# Patient Record
Sex: Female | Born: 1955
Health system: Southern US, Community
[De-identification: ages and names within clinical notes are randomized; demographics above are authoritative.]

## PROBLEM LIST (undated history)

## (undated) DIAGNOSIS — F419 Anxiety disorder, unspecified: Secondary | ICD-10-CM

## (undated) DIAGNOSIS — I10 Essential (primary) hypertension: Secondary | ICD-10-CM

## (undated) HISTORY — DX: Essential (primary) hypertension: I10

## (undated) HISTORY — DX: Anxiety disorder, unspecified: F41.9

---

## 2018-07-16 DIAGNOSIS — R82998 Other abnormal findings in urine: Secondary | ICD-10-CM | POA: Diagnosis not present

## 2018-07-16 DIAGNOSIS — Z1389 Encounter for screening for other disorder: Secondary | ICD-10-CM | POA: Diagnosis not present

## 2018-07-16 DIAGNOSIS — F17200 Nicotine dependence, unspecified, uncomplicated: Secondary | ICD-10-CM | POA: Diagnosis not present

## 2018-07-16 DIAGNOSIS — I10 Essential (primary) hypertension: Secondary | ICD-10-CM | POA: Diagnosis not present

## 2018-07-16 DIAGNOSIS — Z0000 Encounter for general adult medical examination without abnormal findings: Secondary | ICD-10-CM | POA: Diagnosis not present

## 2018-07-18 ENCOUNTER — Other Ambulatory Visit: Payer: Self-pay | Admitting: Internal Medicine

## 2018-07-18 DIAGNOSIS — Z1231 Encounter for screening mammogram for malignant neoplasm of breast: Secondary | ICD-10-CM

## 2018-07-29 DIAGNOSIS — R509 Fever, unspecified: Secondary | ICD-10-CM | POA: Diagnosis not present

## 2018-07-29 DIAGNOSIS — J018 Other acute sinusitis: Secondary | ICD-10-CM | POA: Diagnosis not present

## 2019-09-15 ENCOUNTER — Other Ambulatory Visit: Payer: Self-pay | Admitting: Internal Medicine

## 2019-09-15 DIAGNOSIS — Z1231 Encounter for screening mammogram for malignant neoplasm of breast: Secondary | ICD-10-CM

## 2020-09-08 ENCOUNTER — Other Ambulatory Visit: Payer: Self-pay | Admitting: Internal Medicine

## 2020-09-08 DIAGNOSIS — Z1231 Encounter for screening mammogram for malignant neoplasm of breast: Secondary | ICD-10-CM

## 2021-03-31 ENCOUNTER — Other Ambulatory Visit: Payer: Self-pay

## 2021-03-31 ENCOUNTER — Encounter (HOSPITAL_BASED_OUTPATIENT_CLINIC_OR_DEPARTMENT_OTHER): Payer: Self-pay | Admitting: Emergency Medicine

## 2021-03-31 ENCOUNTER — Emergency Department (HOSPITAL_BASED_OUTPATIENT_CLINIC_OR_DEPARTMENT_OTHER)
Admission: EM | Admit: 2021-03-31 | Discharge: 2021-04-01 | Disposition: A | Discharge status: Home / Self Care | Payer: 59 | Attending: Emergency Medicine | Admitting: Emergency Medicine

## 2021-03-31 ENCOUNTER — Emergency Department (HOSPITAL_BASED_OUTPATIENT_CLINIC_OR_DEPARTMENT_OTHER): Payer: 59 | Admitting: Radiology

## 2021-03-31 DIAGNOSIS — I10 Essential (primary) hypertension: Secondary | ICD-10-CM | POA: Diagnosis not present

## 2021-03-31 DIAGNOSIS — I160 Hypertensive urgency: Secondary | ICD-10-CM

## 2021-03-31 DIAGNOSIS — R0789 Other chest pain: Secondary | ICD-10-CM | POA: Insufficient documentation

## 2021-03-31 DIAGNOSIS — R0602 Shortness of breath: Secondary | ICD-10-CM | POA: Diagnosis not present

## 2021-03-31 DIAGNOSIS — Z87891 Personal history of nicotine dependence: Secondary | ICD-10-CM | POA: Insufficient documentation

## 2021-03-31 HISTORY — DX: Essential (primary) hypertension: I10

## 2021-03-31 LAB — CBC WITH DIFFERENTIAL/PLATELET
Abs Immature Granulocytes: 0.02 10*3/uL (ref 0.00–0.07)
Basophils Absolute: 0 10*3/uL (ref 0.0–0.1)
Basophils Relative: 1 %
Eosinophils Absolute: 0.1 10*3/uL (ref 0.0–0.5)
Eosinophils Relative: 2 %
HCT: 39.6 % (ref 36.0–46.0)
Hemoglobin: 13.8 g/dL (ref 12.0–15.0)
Immature Granulocytes: 0 %
Lymphocytes Relative: 37 %
Lymphs Abs: 2.7 10*3/uL (ref 0.7–4.0)
MCH: 32.1 pg (ref 26.0–34.0)
MCHC: 34.8 g/dL (ref 30.0–36.0)
MCV: 92.1 fL (ref 80.0–100.0)
Monocytes Absolute: 0.5 10*3/uL (ref 0.1–1.0)
Monocytes Relative: 7 %
Neutro Abs: 3.9 10*3/uL (ref 1.7–7.7)
Neutrophils Relative %: 53 %
Platelets: 303 10*3/uL (ref 150–400)
RBC: 4.3 MIL/uL (ref 3.87–5.11)
RDW: 13.3 % (ref 11.5–15.5)
WBC: 7.4 10*3/uL (ref 4.0–10.5)
nRBC: 0 % (ref 0.0–0.2)

## 2021-03-31 MED ORDER — CLONIDINE HCL 0.1 MG PO TABS
0.2000 mg | ORAL_TABLET | Freq: Once | ORAL | Status: AC
  Administered 2021-03-31: 0.2 mg via ORAL
  Filled 2021-03-31: qty 2

## 2021-03-31 NOTE — ED Triage Notes (Signed)
Pt arrives pov with c/o shob today. Pt reports sweats". Pt endorses hx of anxiety, reports L arm pain, bilateral neck pain.

## 2021-03-31 NOTE — ED Provider Notes (Signed)
[facility] EMERGENCY DEPT Provider Note   CSN: [id number] Arrival date & time: 03/31/21  1816     History Chief Complaint  Patient presents with   Shortness of Breath    Bridget Nichols is a 65 y.o. female.  Patient is a 65 year old female with past medical history of hypertension.  Patient presents today for evaluation of episodes of shortness of breath and tightness in her chest.  This began at approximately 6 PM.  She describes a tightness that came and went in waves.  Patient denies any nausea or diaphoresis.  Patient denies any prior cardiac history.  She takes lisinopril for blood pressure, but blood pressure this evening elevated over 200 at home.  The history is provided by the patient.  Shortness of Breath Severity:  Moderate Onset quality:  Sudden Duration:  5 hours Timing:  Intermittent Progression:  Waxing and waning Chronicity:  New Relieved by:  Nothing Worsened by:  Nothing Ineffective treatments:  None tried     Past Medical History:  Diagnosis Date   Hypertension     There are no problems to display for this patient.   Past Surgical History:  Procedure Laterality Date   CESAREAN SECTION       OB History   No obstetric history on file.     History reviewed. No pertinent family history.  Social History   Tobacco Use   Smoking status: Former    Types: Cigarettes  Substance Use Topics   Alcohol use: Yes    Comment: occ   Drug use: Never    Home Medications Prior to Admission medications   Not on File    Allergies    Patient has no allergy information on record.  Review of Systems   Review of Systems  Respiratory:  Positive for shortness of breath.   All other systems reviewed and are negative.  Physical Exam Updated Vital Signs BP (!) 219/109 Comment: RN aware  Pulse 63   Temp 97.9 F (36.6 C) (Oral)   Resp 18   Ht 5\' 5"  (1.651 m)   Wt 59 kg   SpO2 100%   BMI 21.63 kg/m   Physical Exam Vitals and  nursing note reviewed.  Constitutional:      General: She is not in acute distress.    Appearance: She is well-developed. She is not diaphoretic.  HENT:     Head: Normocephalic and atraumatic.  Cardiovascular:     Rate and Rhythm: Normal rate and regular rhythm.     Heart sounds: No murmur heard.   No friction rub. No gallop.  Pulmonary:     Effort: Pulmonary effort is normal. No respiratory distress.     Breath sounds: Normal breath sounds. No wheezing.  Abdominal:     General: Bowel sounds are normal. There is no distension.     Palpations: Abdomen is soft.     Tenderness: There is no abdominal tenderness.  Musculoskeletal:        General: Normal range of motion.     Cervical back: Normal range of motion and neck supple.     Right lower leg: No tenderness. No edema.     Left lower leg: No tenderness. No edema.  Skin:    General: Skin is warm and dry.  Neurological:     General: No focal deficit present.     Mental Status: She is alert and oriented to person, place, and time.    ED Results / Procedures / Treatments  Labs (all labs ordered are listed, but only abnormal results are displayed) Labs Reviewed  COMPREHENSIVE METABOLIC PANEL  BRAIN NATRIURETIC PEPTIDE  CBC WITH DIFFERENTIAL/PLATELET  TROPONIN I (HIGH SENSITIVITY)    EKG EKG Interpretation  Date/Time:  Thursday March 31 2021 19:17:11 EDT Ventricular Rate:  63 PR Interval:  168 QRS Duration: 80 QT Interval:  426 QTC Calculation: 435 R Axis:   79 Text Interpretation: Normal sinus rhythm Anteroseptal infarct , age undetermined Abnormal ECG Confirmed by Geoffery Lyons [phone]) on 03/31/2021 11:05:25 PM  Radiology DG Chest 2 View  Result Date: 03/31/2021 CLINICAL DATA:  Shortness of breath. EXAM: CHEST - 2 VIEW COMPARISON:  None. FINDINGS: The heart size and mediastinal contours are within normal limits. Both lungs are clear. The visualized skeletal structures are unremarkable. IMPRESSION: No active  cardiopulmonary disease. Electronically Signed   By: Lupita Raider M.D.   On: 03/31/2021 20:09    Procedures Procedures   Medications Ordered in ED Medications  cloNIDine (CATAPRES) tablet 0.2 mg (has no administration in time range)    ED Course  I have reviewed the triage vital signs and the nursing notes.  Pertinent labs & imaging results that were available during my care of the patient were reviewed by me and considered in my medical decision making (see chart for details).    MDM Rules/Calculators/A&P  Patient presenting here with complaints of elevated blood pressure.  She began feeling tight in the chest earlier this evening, then checked her blood pressure and it was over 200.  Patient arrives here with blood pressures consistently over 200, then was given clonidine and blood pressure now 129/73.  Her symptoms have completely resolved.  Work-up is otherwise unremarkable showing no evidence for an acute coronary issue.  At this point, patient seems appropriate for discharge with outpatient monitoring of her blood pressure.  I will prescribe a small quantity of clonidine which she can keep on hand if she has another such episode.  Final Clinical Impression(s) / ED Diagnoses Final diagnoses:  None    Rx / DC Orders ED Discharge Orders     None        Geoffery Lyons, MD 04/01/21 0110

## 2021-04-01 LAB — BRAIN NATRIURETIC PEPTIDE: B Natriuretic Peptide: 50.4 pg/mL (ref 0.0–100.0)

## 2021-04-01 LAB — COMPREHENSIVE METABOLIC PANEL
ALT: 11 U/L (ref 0–44)
AST: 14 U/L — ABNORMAL LOW (ref 15–41)
Albumin: 4.6 g/dL (ref 3.5–5.0)
Alkaline Phosphatase: 41 U/L (ref 38–126)
Anion gap: 10 (ref 5–15)
BUN: 15 mg/dL (ref 8–23)
CO2: 25 mmol/L (ref 22–32)
Calcium: 9.8 mg/dL (ref 8.9–10.3)
Chloride: 104 mmol/L (ref 98–111)
Creatinine, Ser: 0.65 mg/dL (ref 0.44–1.00)
GFR, Estimated: >60 mL/min (ref >60)
Glucose, Bld: 100 mg/dL — ABNORMAL HIGH (ref 70–99)
Potassium: 3.8 mmol/L (ref 3.5–5.1)
Sodium: 139 mmol/L (ref 135–145)
Total Bilirubin: 0.3 mg/dL (ref 0.3–1.2)
Total Protein: 7.1 g/dL (ref 6.5–8.1)

## 2021-04-01 LAB — TROPONIN I (HIGH SENSITIVITY): Troponin I (High Sensitivity): 7 ng/L (ref <18)

## 2021-04-01 MED ORDER — CLONIDINE HCL 0.1 MG PO TABS: 0.2000 mg | ORAL_TABLET | Freq: Two times a day (BID) | ORAL | 0 refills | Status: AC | PRN

## 2021-04-01 NOTE — Discharge Instructions (Signed)
Take clonidine as prescribed as needed for blood pressure greater than 200.  Keep a record of your blood pressures at home and follow-up with your primary doctor in the next 7 to 10 days.  Return to the emergency department if symptoms significantly worsen or change.

## 2021-04-11 ENCOUNTER — Encounter (HOSPITAL_BASED_OUTPATIENT_CLINIC_OR_DEPARTMENT_OTHER): Payer: Self-pay | Admitting: Obstetrics and Gynecology

## 2021-04-11 ENCOUNTER — Other Ambulatory Visit: Payer: Self-pay

## 2021-04-11 DIAGNOSIS — R6884 Jaw pain: Secondary | ICD-10-CM | POA: Insufficient documentation

## 2021-04-11 DIAGNOSIS — I10 Essential (primary) hypertension: Secondary | ICD-10-CM | POA: Insufficient documentation

## 2021-04-11 DIAGNOSIS — M25512 Pain in left shoulder: Secondary | ICD-10-CM | POA: Insufficient documentation

## 2021-04-11 DIAGNOSIS — R519 Headache, unspecified: Secondary | ICD-10-CM | POA: Diagnosis not present

## 2021-04-11 DIAGNOSIS — R030 Elevated blood-pressure reading, without diagnosis of hypertension: Secondary | ICD-10-CM | POA: Diagnosis present

## 2021-04-11 LAB — BASIC METABOLIC PANEL
Anion gap: 11 (ref 5–15)
BUN: 14 mg/dL (ref 8–23)
CO2: 24 mmol/L (ref 22–32)
Calcium: 10.1 mg/dL (ref 8.9–10.3)
Chloride: 100 mmol/L (ref 98–111)
Creatinine, Ser: 0.66 mg/dL (ref 0.44–1.00)
GFR, Estimated: >60 mL/min (ref >60)
Glucose, Bld: 99 mg/dL (ref 70–99)
Potassium: 3.7 mmol/L (ref 3.5–5.1)
Sodium: 135 mmol/L (ref 135–145)

## 2021-04-11 LAB — CBC
HCT: 40 % (ref 36.0–46.0)
Hemoglobin: 14.2 g/dL (ref 12.0–15.0)
MCH: 32.7 pg (ref 26.0–34.0)
MCHC: 35.5 g/dL (ref 30.0–36.0)
MCV: 92.2 fL (ref 80.0–100.0)
Platelets: 304 10*3/uL (ref 150–400)
RBC: 4.34 MIL/uL (ref 3.87–5.11)
RDW: 13 % (ref 11.5–15.5)
WBC: 7.9 10*3/uL (ref 4.0–10.5)
nRBC: 0 % (ref 0.0–0.2)

## 2021-04-11 LAB — TROPONIN I (HIGH SENSITIVITY): Troponin I (High Sensitivity): 6 ng/L (ref <18)

## 2021-04-11 NOTE — ED Triage Notes (Signed)
Patient reports to the ER for Hypertension. Patient reports her BP keeps going up and that "no one is taking it seriously". Patient reports she has seen her PCP and they changed her medication dose but that her BP is still 200's/110s. Patient endorses headache, denies N/V/D. Patient reports it causes pain in her left shoulder and jaw

## 2021-04-12 ENCOUNTER — Emergency Department (HOSPITAL_BASED_OUTPATIENT_CLINIC_OR_DEPARTMENT_OTHER)
Admission: EM | Admit: 2021-04-12 | Discharge: 2021-04-12 | Disposition: A | Discharge status: Home / Self Care | Payer: 59 | Attending: Emergency Medicine | Admitting: Emergency Medicine

## 2021-04-12 NOTE — ED Notes (Signed)
Pt notified registration staff that the she was leaving. Encouraged to stay. Pt left

## 2021-05-18 ENCOUNTER — Telehealth: Payer: Self-pay

## 2021-05-18 NOTE — Telephone Encounter (Signed)
NOTES SCANNED TO REFERRAL 

## 2021-05-18 NOTE — Telephone Encounter (Signed)
NOTES SCANNED TO REFERRAL RJ 

## 2021-05-23 ENCOUNTER — Other Ambulatory Visit: Payer: Self-pay | Admitting: Family Medicine

## 2021-05-23 DIAGNOSIS — I160 Hypertensive urgency: Secondary | ICD-10-CM

## 2021-05-24 ENCOUNTER — Other Ambulatory Visit: Payer: Self-pay | Admitting: Family Medicine

## 2021-05-24 DIAGNOSIS — R079 Chest pain, unspecified: Secondary | ICD-10-CM

## 2021-05-24 DIAGNOSIS — R7989 Other specified abnormal findings of blood chemistry: Secondary | ICD-10-CM

## 2021-05-25 ENCOUNTER — Ambulatory Visit
Admission: RE | Admit: 2021-05-25 | Discharge: 2021-05-25 | Disposition: A | Discharge status: Home / Self Care | Payer: 59 | Source: Ambulatory Visit | Attending: Family Medicine | Admitting: Family Medicine

## 2021-05-25 DIAGNOSIS — R079 Chest pain, unspecified: Secondary | ICD-10-CM

## 2021-05-25 DIAGNOSIS — R7989 Other specified abnormal findings of blood chemistry: Secondary | ICD-10-CM

## 2021-05-25 MED ORDER — IOPAMIDOL (ISOVUE-370) INJECTION 76%
75.0000 mL | Freq: Once | INTRAVENOUS | Status: AC | PRN
  Administered 2021-05-25: 75 mL via INTRAVENOUS

## 2021-05-27 DIAGNOSIS — I10 Essential (primary) hypertension: Secondary | ICD-10-CM | POA: Insufficient documentation

## 2021-05-27 NOTE — Assessment & Plan Note (Addendum)
I will restart the patient's lisinopril 40 mg and start Bystolic 2.5 mg.  I will have her see our pharmacy division here for med titration.  I will obtain an echocardiogram.  I will see her back in 3 months or earlier if needed.  We may need to consider adding back amlodipine if by systolic titration is not helpful.  Additionally she may need a work-up for secondary causes of hypertension.  A renal ultrasound has already been ordered.  If this is negative, she may need additional evaluation (hyperaldosteronism, pheochromocytoma, etc).

## 2021-05-27 NOTE — Progress Notes (Signed)
Cardiology Office Note:    Date:  05/30/2021   ID:  Bridget Nichols, DOB 08/10/1955, MRN [id number]  PCP:  Lavada Mesi, MD   [facility] Providers Cardiologist:  Orbie Pyo, MD     Referring MD: Lavada Mesi, MD   Chief Complaint: Hypertension  History of Present Illness:    PROBLEM LIST: 1.  Hypertension 2.  Aortic atherosclerosis seen on PE CT scan   Bridget Nichols is a 65 y.o. female with the indicated medical problems here for recommendations regarding hypertension.  She is seen in the emergency department in early September with a blood pressure of over .  Her EKG and troponins were unremarkable.  A renal ultrasound was ordered but has not yet been performed.  She was seen by her primary care provider and her lisinopril was stopped because it was not helping.  She had previously been on amlodipine 10 mg and hydrochlorothiazide.  These were stopped when clonidine was started.  She occasionally gets headaches.  At times she feels a chest fullness that lasts minutes that comes on at rest and sometimes with exertion.  She she has had no presyncope or syncope.  She denies any peripheral edema or paroxysmal nocturnal dyspnea.  She has required no hospitalizations or emergency room visits since her last one.    Previous Medical/Surgical History:    Past Medical History:  Diagnosis Date   Anxiety    High blood pressure    Hypertension     Past Surgical History:  Procedure Laterality Date   CESAREAN SECTION      Current Medications: Current Meds  Medication Sig   aspirin EC 81 MG tablet Take 1 tablet (81 mg total) by mouth daily. Swallow whole.   atorvastatin (LIPITOR) 40 MG tablet Take 1 tablet (40 mg total) by mouth daily.   cloNIDine (CATAPRES) 0.1 MG tablet Take 2 tablets (0.2 mg total) by mouth 2 (two) times daily as needed.   Collagen-Vitamin C-Biotin (COLLAGEN 1500/C PO) Take by mouth.   hydrALAZINE (APRESOLINE) 10 MG tablet Take 20 mg by mouth 4  (four) times daily.   Levomefolate Glucosamine (METHYLFOLATE) 400 MCG CAPS Take by mouth.   lisinopril (ZESTRIL) 40 MG tablet Take 1 tablet (40 mg total) by mouth daily.   LORazepam (ATIVAN) 0.5 MG tablet Take 0.5 mg by mouth every 8 (eight) hours.   metoprolol tartrate (LOPRESSOR) 100 MG tablet Take 1 tablet (100 mg total) by mouth once for 1 dose. Take two hours prior to CT scan   nebivolol (BYSTOLIC) 2.5 MG tablet Take 1 tablet (2.5 mg total) by mouth daily.   Omega-3 1000 MG CAPS Take by mouth.   Taurine 500 MG CAPS Take by mouth.   [DISCONTINUED] aspirin 81 MG EC tablet Take 162 mg by mouth 2 (two) times daily. Swallow whole.     Allergies:   Tetracycline   Social History:    Social History   Socioeconomic History   Marital status: Single    Spouse name: Not on file   Number of children: Not on file   Years of education: Not on file   Highest education level: Not on file  Occupational History   Not on file  Tobacco Use   Smoking status: Former    Types: Cigarettes    Passive exposure: Current   Smokeless tobacco: Never  Vaping Use   Vaping Use: Never used  Substance and Sexual Activity   Alcohol use: Yes    Comment: occ  Drug use: Never   Sexual activity: Yes  Other Topics Concern   Not on file  Social History Narrative   Not on file   Social Determinants of Health   Financial Resource Strain: Not on file  Food Insecurity: Not on file  Transportation Needs: Not on file  Physical Activity: Not on file  Stress: Not on file  Social Connections: Not on file     Family History:  The patient's family history is not on file.  ROS:  Please see the history of present illness.  All other systems reviewed and are negative.  EKGs/Labs/Other Studies Reviewed:    The following studies were reviewed today: EKG from early September demonstrates sinus rhythm with anterior infarct pattern.  Laboratories demonstrated normal high sensitive troponin and a normal  creatinine.    Recent Labs: 03/31/2021: ALT 11; B Natriuretic Peptide 50.4 04/11/2021: BUN 14; Creatinine, Ser 0.66; Hemoglobin 14.2; Platelets 304; Potassium 3.7; Sodium 135   Recent Lipid Panel No results found for: CHOL, TRIG, HDL, CHOLHDL, VLDL, LDLCALC, LDLDIRECT   Risk Assessment/Calculations:          Physical Exam:    VS:  BP (!) 174/90   Pulse 74   Ht 5\' 5"  (1.651 m)   Wt 131 lb (59.4 kg)   SpO2 98%   BMI 21.80 kg/m     Wt Readings from Last 3 Encounters:  05/30/21 131 lb (59.4 kg)  03/31/21 130 lb (59 kg)     GEN:  Well nourished, well developed in no acute distress HEENT: Normal NECK: No JVD; No carotid bruits LYMPHATICS: No lymphadenopathy CARDIAC: RRR, no murmurs, rubs, gallops RESPIRATORY:  Clear to auscultation without rales, wheezing or rhonchi  ABDOMEN: Soft, non-tender, non-distended MUSCULOSKELETAL:  No edema; No deformity  SKIN: Warm and dry NEUROLOGIC:  Alert and oriented x 3 PSYCHIATRIC:  Normal affect   ASSESSMENT and PLAN   Hypertension I will restart the patient's lisinopril 40 mg and start Bystolic 2.5 mg.  I will have her see our pharmacy division here for med titration.  I will obtain an echocardiogram.  I will see her back in 3 months or earlier if needed.  We may need to consider adding back amlodipine if by systolic titration is not helpful.  Additionally she may need a work-up for secondary causes of hypertension.  A renal ultrasound has already been ordered.  If this is negative, she may need additional evaluation (hyperaldosteronism, pheochromocytoma, etc).  Atherosclerosis of aorta (HCC) I will have her decrease her aspirin to 81 mg.  She has evidence of atherosclerosis in her aorta therefore we should treat this.  I will have her start atorvastatin 40 mg.  Her goal LDL is less than 70.  We will check a lipid panel and CMP later.  Precordial pain I will obtain a coronary CTA to evaluate further.              Medication  Adjustments/Labs and Tests Ordered: Current medicines are reviewed at length with the patient today.  Concerns regarding medicines are outlined above.  Orders Placed This Encounter  Procedures   CT CORONARY MORPH W/CTA COR W/SCORE W/CA W/CM &/OR WO/CM   Basic metabolic panel   Comprehensive metabolic panel   Lipid panel   AMB Referral to Heartcare Pharm-D   ECHOCARDIOGRAM COMPLETE    Meds ordered this encounter  Medications   aspirin EC 81 MG tablet    Sig: Take 1 tablet (81 mg total) by mouth daily. Swallow  whole.    Dispense:  90 tablet    Refill:  3   lisinopril (ZESTRIL) 40 MG tablet    Sig: Take 1 tablet (40 mg total) by mouth daily.    Dispense:  90 tablet    Refill:  3   nebivolol (BYSTOLIC) 2.5 MG tablet    Sig: Take 1 tablet (2.5 mg total) by mouth daily.    Dispense:  90 tablet    Refill:  3   atorvastatin (LIPITOR) 40 MG tablet    Sig: Take 1 tablet (40 mg total) by mouth daily.    Dispense:  90 tablet    Refill:  3   metoprolol tartrate (LOPRESSOR) 100 MG tablet    Sig: Take 1 tablet (100 mg total) by mouth once for 1 dose. Take two hours prior to CT scan    Dispense:  1 tablet    Refill:  0     Patient Instructions  Medication Instructions:  Your physician has recommended you make the following change in your medication:  1) DECREASE Aspirin to 81 mg daily  2) RESTART taking lisinopril 40 mg daily  3) START taking Bystolic (nebivolol) 2.5 mg daily  4) START taking Lipitor (atorvastatin) 40 mg daily   *If you need a refill on your cardiac medications before your next appointment, please call your pharmacy*  Lab Work: TODAY: BMET Fasting lipids and CMET on same day as 3 month FU  If you have labs (blood work) drawn today and your tests are completely normal, you will receive your results only by: MyChart Message (if you have MyChart) OR A paper copy in the mail If you have any lab test that is abnormal or we need to change your treatment, we will call  you to review the results.  Testing/Procedures: Your physician has requested that you have an echocardiogram. Echocardiography is a painless test that uses sound waves to create images of your heart. It provides your doctor with information about the size and shape of your heart and how well your heart's chambers and valves are working. This procedure takes approximately one hour. There are no restrictions for this procedure.  Follow-Up: At [facility], you and your health needs are our priority.  As part of our continuing mission to provide you with exceptional heart care, we have created designated Provider Care Teams.  These Care Teams include your primary Cardiologist (physician) and Advanced Practice Providers (APPs -  Physician Assistants and Nurse Practitioners) who all work together to provide you with the care you need, when you need it.  Your next appointment:   3 month(s)  The format for your next appointment:   In Person  Provider:   Orbie Pyo, MD {  Other Instructions You have been referred to see our PharmD for up-titration of your cardiac medications      Your cardiac CT will be scheduled at:   [facility] [address] [place], [place] [address] [phone]  If scheduled at [facility], please arrive at the [facility] main entrance (entrance A) of [facility] 30 minutes prior to test start time. You can use the FREE valet parking offered at the main entrance (encouraged to control the heart rate for the test) Proceed to the [facility] Radiology Department (first floor) to check-in and test prep.  Please follow these instructions carefully (unless otherwise directed):  On the Night Before the Test: Be sure to Drink plenty of water. Do  not consume any caffeinated/decaffeinated beverages or chocolate 12 hours prior to your test. Do not take any antihistamines 12 hours prior to your test.  On the Day of the Test: Drink  plenty of water until 1 hour prior to the test. Do not eat any food 4 hours prior to the test. You may take your regular medications prior to the test.  Take metoprolol (Lopressor) two hours prior to test. HOLD Furosemide/Hydrochlorothiazide morning of the test. FEMALES- please wear underwire-free bra if available, avoid dresses & tight clothing      After the Test: Drink plenty of water. After receiving IV contrast, you may experience a mild flushed feeling. This is normal. On occasion, you may experience a mild rash up to 24 hours after the test. This is not dangerous. If this occurs, you can take Benadryl 25 mg and increase your fluid intake. If you experience trouble breathing, this can be serious. If it is severe call 911 IMMEDIATELY. If it is mild, please call our office. If you take any of these medications: Glipizide/Metformin, Avandament, Glucavance, please do not take 48 hours after completing test unless otherwise instructed.  Please allow 2-4 weeks for scheduling of routine cardiac CTs. Some insurance companies require a pre-authorization which may delay scheduling of this test.   For non-scheduling related questions, please contact the cardiac imaging nurse navigator should you have any questions/concerns: Rockwell Alexandria, Cardiac Imaging Nurse Navigator Larey Brick, Cardiac Imaging Nurse Navigator [facility] Direct Office Dial: [phone]   For scheduling needs, including cancellations and rescheduling, please call [place], [phone].    Signed, Orbie Pyo, MD  05/30/2021 8:30 AM    [facility]

## 2021-05-30 ENCOUNTER — Encounter: Payer: Self-pay | Admitting: Internal Medicine

## 2021-05-30 ENCOUNTER — Ambulatory Visit (INDEPENDENT_AMBULATORY_CARE_PROVIDER_SITE_OTHER): Payer: 59 | Admitting: Internal Medicine

## 2021-05-30 ENCOUNTER — Other Ambulatory Visit: Payer: Self-pay

## 2021-05-30 ENCOUNTER — Other Ambulatory Visit: Payer: Self-pay | Admitting: Internal Medicine

## 2021-05-30 DIAGNOSIS — R072 Precordial pain: Secondary | ICD-10-CM | POA: Diagnosis not present

## 2021-05-30 DIAGNOSIS — I10 Essential (primary) hypertension: Secondary | ICD-10-CM

## 2021-05-30 DIAGNOSIS — I700 Atherosclerosis of aorta: Secondary | ICD-10-CM | POA: Diagnosis not present

## 2021-05-30 LAB — BASIC METABOLIC PANEL
BUN/Creatinine Ratio: 9 — ABNORMAL LOW (ref 12–28)
BUN: 6 mg/dL — ABNORMAL LOW (ref 8–27)
CO2: 25 mmol/L (ref 20–29)
Calcium: 10.1 mg/dL (ref 8.7–10.3)
Chloride: 103 mmol/L (ref 96–106)
Creatinine, Ser: 0.69 mg/dL (ref 0.57–1.00)
Glucose: 92 mg/dL (ref 70–99)
Potassium: 4.2 mmol/L (ref 3.5–5.2)
Sodium: 139 mmol/L (ref 134–144)
eGFR: 97 mL/min/{1.73_m2} (ref >59)

## 2021-05-30 MED ORDER — METOPROLOL TARTRATE 100 MG PO TABS: 100.0000 mg | ORAL_TABLET | Freq: Once | ORAL | 0 refills | Status: AC

## 2021-05-30 MED ORDER — ATORVASTATIN CALCIUM 40 MG PO TABS: 40.0000 mg | ORAL_TABLET | Freq: Every day | ORAL | 3 refills | Status: AC

## 2021-05-30 MED ORDER — LISINOPRIL 40 MG PO TABS: 40.0000 mg | ORAL_TABLET | Freq: Every day | ORAL | 3 refills | Status: DC

## 2021-05-30 MED ORDER — ASPIRIN EC 81 MG PO TBEC: 81.0000 mg | DELAYED_RELEASE_TABLET | Freq: Every day | ORAL | 3 refills | Status: AC

## 2021-05-30 MED ORDER — NEBIVOLOL HCL 2.5 MG PO TABS: 2.5000 mg | ORAL_TABLET | Freq: Every day | ORAL | 3 refills | Status: DC

## 2021-05-30 NOTE — Assessment & Plan Note (Addendum)
I will have her decrease her aspirin to 81 mg.  She has evidence of atherosclerosis in her aorta therefore we should treat this.  I will have her start atorvastatin 40 mg.  Her goal LDL is less than 70.  We will check a lipid panel and CMP later.

## 2021-05-30 NOTE — Assessment & Plan Note (Signed)
I will obtain a coronary CTA to evaluate further.

## 2021-05-30 NOTE — Patient Instructions (Addendum)
Medication Instructions:  Your physician has recommended you make the following change in your medication:  1) DECREASE Aspirin to 81 mg daily  2) RESTART taking lisinopril 40 mg daily  3) START taking Bystolic (nebivolol) 2.5 mg daily  4) START taking Lipitor (atorvastatin) 40 mg daily   *If you need a refill on your cardiac medications before your next appointment, please call your pharmacy*  Lab Work: TODAY: BMET Fasting lipids and CMET on same day as 3 month FU  If you have labs (blood work) drawn today and your tests are completely normal, you will receive your results only by: MyChart Message (if you have MyChart) OR A paper copy in the mail If you have any lab test that is abnormal or we need to change your treatment, we will call you to review the results.  Testing/Procedures: Your physician has requested that you have an echocardiogram. Echocardiography is a painless test that uses sound waves to create images of your heart. It provides your doctor with information about the size and shape of your heart and how well your heart's chambers and valves are working. This procedure takes approximately one hour. There are no restrictions for this procedure.  Follow-Up: At [facility], you and your health needs are our priority.  As part of our continuing mission to provide you with exceptional heart care, we have created designated Provider Care Teams.  These Care Teams include your primary Cardiologist (physician) and Advanced Practice Providers (APPs -  Physician Assistants and Nurse Practitioners) who all work together to provide you with the care you need, when you need it.  Your next appointment:   3 month(s)  The format for your next appointment:   In Person  Provider:   Orbie Pyo, MD {  Other Instructions You have been referred to see our PharmD for up-titration of your cardiac medications      Your cardiac CT will be scheduled at:   [facility] [address] [place], [place] [address] [phone]  If scheduled at [facility], please arrive at the [facility] main entrance (entrance A) of [facility] 30 minutes prior to test start time. You can use the FREE valet parking offered at the main entrance (encouraged to control the heart rate for the test) Proceed to the [facility] Radiology Department (first floor) to check-in and test prep.  Please follow these instructions carefully (unless otherwise directed):  On the Night Before the Test: Be sure to Drink plenty of water. Do not consume any caffeinated/decaffeinated beverages or chocolate 12 hours prior to your test. Do not take any antihistamines 12 hours prior to your test.  On the Day of the Test: Drink plenty of water until 1 hour prior to the test. Do not eat any food 4 hours prior to the test. You may take your regular medications prior to the test.  Take metoprolol (Lopressor) two hours prior to test. HOLD Furosemide/Hydrochlorothiazide morning of the test. FEMALES- please wear underwire-free bra if available, avoid dresses & tight clothing      After the Test: Drink plenty of water. After receiving IV contrast, you may experience a mild flushed feeling. This is normal. On occasion, you may experience a mild rash up to 24 hours after the test. This is not dangerous. If this occurs, you can take Benadryl 25 mg and increase your fluid intake. If you experience trouble breathing, this can be serious. If it is severe call 911  IMMEDIATELY. If it is mild, please call our office. If you take any of these medications: Glipizide/Metformin, Avandament, Glucavance, please do not take 48 hours after completing test unless otherwise instructed.  Please allow 2-4 weeks for scheduling of routine cardiac CTs. Some insurance companies require a pre-authorization which may delay scheduling of this test.   For non-scheduling related questions, please  contact the cardiac imaging nurse navigator should you have any questions/concerns: Rockwell Alexandria, Cardiac Imaging Nurse Navigator Larey Brick, Cardiac Imaging Nurse Navigator [place] [facility] Direct Office Dial: [phone]   For scheduling needs, including cancellations and rescheduling, please call [place], [phone].

## 2021-05-31 ENCOUNTER — Ambulatory Visit
Admission: RE | Admit: 2021-05-31 | Discharge: 2021-05-31 | Disposition: A | Discharge status: Home / Self Care | Payer: 59 | Source: Ambulatory Visit | Attending: Family Medicine | Admitting: Family Medicine

## 2021-05-31 DIAGNOSIS — I160 Hypertensive urgency: Secondary | ICD-10-CM

## 2021-06-01 ENCOUNTER — Encounter (HOSPITAL_BASED_OUTPATIENT_CLINIC_OR_DEPARTMENT_OTHER): Payer: Self-pay

## 2021-06-01 ENCOUNTER — Emergency Department (HOSPITAL_BASED_OUTPATIENT_CLINIC_OR_DEPARTMENT_OTHER)
Admission: EM | Admit: 2021-06-01 | Discharge: 2021-06-01 | Disposition: A | Discharge status: Home / Self Care | Payer: 59 | Attending: Emergency Medicine | Admitting: Emergency Medicine

## 2021-06-01 ENCOUNTER — Other Ambulatory Visit: Payer: Self-pay

## 2021-06-01 ENCOUNTER — Telehealth: Payer: Self-pay

## 2021-06-01 ENCOUNTER — Emergency Department (HOSPITAL_BASED_OUTPATIENT_CLINIC_OR_DEPARTMENT_OTHER): Payer: 59

## 2021-06-01 DIAGNOSIS — R002 Palpitations: Secondary | ICD-10-CM

## 2021-06-01 DIAGNOSIS — R0602 Shortness of breath: Secondary | ICD-10-CM | POA: Diagnosis present

## 2021-06-01 DIAGNOSIS — Z79899 Other long term (current) drug therapy: Secondary | ICD-10-CM | POA: Insufficient documentation

## 2021-06-01 DIAGNOSIS — I10 Essential (primary) hypertension: Secondary | ICD-10-CM | POA: Insufficient documentation

## 2021-06-01 DIAGNOSIS — F419 Anxiety disorder, unspecified: Secondary | ICD-10-CM | POA: Diagnosis not present

## 2021-06-01 DIAGNOSIS — Z7982 Long term (current) use of aspirin: Secondary | ICD-10-CM | POA: Diagnosis not present

## 2021-06-01 DIAGNOSIS — Z87891 Personal history of nicotine dependence: Secondary | ICD-10-CM | POA: Diagnosis not present

## 2021-06-01 LAB — CBC WITH DIFFERENTIAL/PLATELET
Abs Immature Granulocytes: 0.03 10*3/uL (ref 0.00–0.07)
Basophils Absolute: 0.1 10*3/uL (ref 0.0–0.1)
Basophils Relative: 1 %
Eosinophils Absolute: 0.2 10*3/uL (ref 0.0–0.5)
Eosinophils Relative: 3 %
HCT: 39.8 % (ref 36.0–46.0)
Hemoglobin: 13.8 g/dL (ref 12.0–15.0)
Immature Granulocytes: 0 %
Lymphocytes Relative: 42 %
Lymphs Abs: 3.1 10*3/uL (ref 0.7–4.0)
MCH: 32.5 pg (ref 26.0–34.0)
MCHC: 34.7 g/dL (ref 30.0–36.0)
MCV: 93.9 fL (ref 80.0–100.0)
Monocytes Absolute: 0.8 10*3/uL (ref 0.1–1.0)
Monocytes Relative: 11 %
Neutro Abs: 3.2 10*3/uL (ref 1.7–7.7)
Neutrophils Relative %: 43 %
Platelets: 337 10*3/uL (ref 150–400)
RBC: 4.24 MIL/uL (ref 3.87–5.11)
RDW: 13.7 % (ref 11.5–15.5)
WBC: 7.3 10*3/uL (ref 4.0–10.5)
nRBC: 0 % (ref 0.0–0.2)

## 2021-06-01 LAB — BASIC METABOLIC PANEL
Anion gap: 12 (ref 5–15)
BUN: 11 mg/dL (ref 8–23)
CO2: 22 mmol/L (ref 22–32)
Calcium: 9.1 mg/dL (ref 8.9–10.3)
Chloride: 104 mmol/L (ref 98–111)
Creatinine, Ser: 0.61 mg/dL (ref 0.44–1.00)
GFR, Estimated: >60 mL/min (ref >60)
Glucose, Bld: 79 mg/dL (ref 70–99)
Potassium: 3.7 mmol/L (ref 3.5–5.1)
Sodium: 138 mmol/L (ref 135–145)

## 2021-06-01 LAB — TSH: TSH: 4.507 u[IU]/mL — ABNORMAL HIGH (ref 0.350–4.500)

## 2021-06-01 LAB — TROPONIN I (HIGH SENSITIVITY): Troponin I (High Sensitivity): 5 ng/L (ref <18)

## 2021-06-01 MED ORDER — LORAZEPAM 2 MG/ML IJ SOLN
1.0000 mg | Freq: Once | INTRAMUSCULAR | Status: AC
  Administered 2021-06-01: 1 mg via INTRAVENOUS
  Filled 2021-06-01: qty 1

## 2021-06-01 NOTE — Telephone Encounter (Deleted)
**Note De-Identified Bridget Nichols Obfuscation** Nebivolol PA started through [facility]. Key: [id number]

## 2021-06-01 NOTE — ED Triage Notes (Signed)
Patient woke up and just did not feel right. Monitor at home revealed heart rate of 127 with elevated blood pressure of 197/90

## 2021-06-01 NOTE — Telephone Encounter (Signed)
This was [facility] 2 days ago at [facility]   The PA has been started.  A refill is not needed.

## 2021-06-01 NOTE — ED Provider Notes (Signed)
MEDCENTER [facility] EMERGENCY DEPT Provider Note   CSN: [id number] Arrival date & time: 06/01/21  0556     History Chief Complaint  Patient presents with   Tachycardia    Bridget Nichols is a 65 y.o. female.  HPI     This is a 65 year old female with a history of hypertension who presents with palpitations and shortness of breath.  Patient reports that she woke up around 2 AM and did not feel right.  She took her blood pressure and it was normal.  She laid back down and then woke up again at 5.  She took her blood pressure and pulse rate.  She revealed that her heart rate was in the 120s and her blood pressure was 197/90.  She states she felt very anxious.  It is unclear whether she felt anxious prior to giving the readings or just afterwards.  She denies chest pain but reports some shortness of breath and palpitations.  She reports that she is currently taking lisinopril and Bystolic.  She also takes clonidine up to 4 times daily.  She took a clonidine just prior to arrival.  She has been seen and evaluated multiple times for the same.  She saw cardiology on 11/7.  They have recommended echocardiogram, CT coronary study and adjusted her blood pressure medications.  She has not been able to pick up the additional medication.  She has no known thyroid disease.  No recent fevers or illnesses.  Past Medical History:  Diagnosis Date   Anxiety    High blood pressure    Hypertension     Patient Active Problem List   Diagnosis Date Noted   Atherosclerosis of aorta (HCC) 05/30/2021   Precordial pain 05/30/2021   Hypertension 05/27/2021    Past Surgical History:  Procedure Laterality Date   CESAREAN SECTION       OB History     Gravida      Para      Term      Preterm      AB      Living  2      SAB      IAB      Ectopic      Multiple      Live Births              History reviewed. No pertinent family history.  Social History   Tobacco Use    Smoking status: Former    Types: Cigarettes    Passive exposure: Current   Smokeless tobacco: Never  Vaping Use   Vaping Use: Never used  Substance Use Topics   Alcohol use: Yes    Comment: occ   Drug use: Never    Home Medications Prior to Admission medications   Medication Sig Start Date End Date Taking? Authorizing Provider  aspirin EC 81 MG tablet Take 1 tablet (81 mg total) by mouth daily. Swallow whole. 05/30/21   Orbie Pyo, MD  atorvastatin (LIPITOR) 40 MG tablet Take 1 tablet (40 mg total) by mouth daily. 05/30/21   Orbie Pyo, MD  cloNIDine (CATAPRES - DOSED IN MG/24 HR) 0.2 mg/24hr patch Place 0.2 mg onto the skin once a week. Patient not taking: Reported on 05/30/2021    [provider]  cloNIDine (CATAPRES) 0.1 MG tablet Take 2 tablets (0.2 mg total) by mouth 2 (two) times daily as needed. 04/01/21   Geoffery Lyons, MD  Collagen-Vitamin C-Biotin (COLLAGEN 1500/C PO) Take by mouth.  [provider]  hydrALAZINE (APRESOLINE) 10 MG tablet Take 20 mg by mouth 4 (four) times daily. 05/23/21   [provider]  hydrochlorothiazide (HYDRODIURIL) 25 MG tablet Take 25 mg by mouth daily. Patient not taking: Reported on 05/30/2021    [provider]  Levomefolate Glucosamine (METHYLFOLATE) 400 MCG CAPS Take by mouth.    [provider]  lisinopril (ZESTRIL) 40 MG tablet Take 1 tablet (40 mg total) by mouth daily. 05/30/21   Orbie Pyo, MD  LORazepam (ATIVAN) 0.5 MG tablet Take 0.5 mg by mouth every 8 (eight) hours.    [provider]  magnesium oxide (MAG-OX) 400 MG tablet Take 400 mg by mouth daily. Patient not taking: Reported on 05/30/2021    [provider]  metoprolol tartrate (LOPRESSOR) 100 MG tablet Take 1 tablet (100 mg total) by mouth once for 1 dose. Take two hours prior to CT scan 05/30/21 05/30/21  Orbie Pyo, MD  nebivolol (BYSTOLIC) 2.5 MG tablet Take 1 tablet (2.5 mg total) by mouth daily.  05/30/21   Orbie Pyo, MD  Omega-3 1000 MG CAPS Take by mouth.    [provider]  Taurine 500 MG CAPS Take by mouth.    [provider]    Allergies    Tetracycline  Review of Systems   Review of Systems  Constitutional:  Negative for fever.  Respiratory:  Positive for shortness of breath. Negative for cough and chest tightness.   Cardiovascular:  Positive for palpitations. Negative for chest pain.  Gastrointestinal:  Negative for abdominal pain, nausea and vomiting.  Genitourinary:  Negative for dysuria.  Skin:  Negative for wound.  Neurological:  Negative for headaches.  Psychiatric/Behavioral:  The patient is nervous/anxious.   All other systems reviewed and are negative.  Physical Exam Updated Vital Signs BP (!) 194/98 (BP Location: Right Arm)   Pulse 94   Resp (!) 24   Ht 1.626 m (5\' 4" )   Wt 58.5 kg   SpO2 100%   BMI 22.14 kg/m   Physical Exam Vitals and nursing note reviewed.  Constitutional:      Appearance: She is well-developed.     Comments: Anxious, tearful, nontoxic  HENT:     Head: Normocephalic and atraumatic.     Right Ear: Tympanic membrane normal.     Left Ear: Tympanic membrane normal.  Eyes:     Pupils: Pupils are equal, round, and reactive to light.  Cardiovascular:     Rate and Rhythm: Normal rate and regular rhythm.     Heart sounds: Normal heart sounds.  Pulmonary:     Effort: Pulmonary effort is normal. No respiratory distress.     Breath sounds: No wheezing.  Abdominal:     General: Bowel sounds are normal.     Palpations: Abdomen is soft.  Musculoskeletal:     Cervical back: Neck supple.     Right lower leg: No edema.     Left lower leg: No edema.  Skin:    General: Skin is warm and dry.  Neurological:     Mental Status: She is alert and oriented to person, place, and time.  Psychiatric:     Comments: Very anxious appearing    ED Results / Procedures / Treatments   Labs (all labs ordered are listed,  but only abnormal results are displayed) Labs Reviewed  CBC WITH DIFFERENTIAL/PLATELET  BASIC METABOLIC PANEL  TSH  TROPONIN I (HIGH SENSITIVITY)    EKG EKG Interpretation  Date/Time:  Wednesday June 01 2021 06:06:34 EST Ventricular Rate:  84 PR Interval:  172 QRS Duration: 88 QT Interval:  377 QTC Calculation: 446 R Axis:   60 Text Interpretation: Sinus rhythm Consider right atrial enlargement Anterior infarct, old Confirmed by Ross Marcus ([id number]) on 06/01/2021 6:17:52 AM  Radiology US RENAL ARTERY DUPLEX COMPLETE  Result Date: 05/31/2021 CLINICAL DATA:  Hypertensive urgency EXAM: RENAL/URINARY TRACT ULTRASOUND RENAL DUPLEX DOPPLER ULTRASOUND COMPARISON:  None. FINDINGS: Right Kidney: Length: 11.8 cm. Echogenicity within normal limits. No mass or hydronephrosis visualized. Left Kidney: Length: 11.4 cm. Echogenicity within normal limits. No mass or hydronephrosis visualized. 8 mm echogenic focus in the lower pole suspicious for nonobstructing calculus. Bladder:  Unremarkable. RENAL DUPLEX ULTRASOUND Right Renal Artery Velocities: Origin:  145 cm/sec Mid:  173 cm/sec Hilum:  157 cm/sec Interlobar:  77 cm/sec Arcuate:  36 cm/sec Left Renal Artery Velocities: Origin:  117 cm/sec Mid:  140 cm/sec Hilum:  163 cm/sec Interlobar:  87 cm/sec Arcuate:  31 cm/sec Aortic Velocity:  87/17 cm/sec Right Renal-Aortic Ratios: Origin: 1.7 Mid:  2.0 Hilum: 1.8 Interlobar: 0.9 Arcuate: 0.4 Left Renal-Aortic Ratios: Origin: 1.3 Mid: 1.6 Hilum: 1.9 Interlobar: 1.0 Arcuate: 0.4 IMPRESSION: No Doppler evidence of significant renal artery stenosis. Electronically Signed   By: Acquanetta Belling M.D.   On: 05/31/2021 10:11    Procedures Procedures   Medications Ordered in ED Medications  LORazepam (ATIVAN) injection 1 mg (has no administration in time range)    ED Course  I have reviewed the triage vital signs and the nursing notes.  Pertinent labs & imaging results that were available during my care of  the patient were reviewed by me and considered in my medical decision making (see chart for details).    MDM Rules/Calculators/A&P                           Patient presents with palpitations and shortness of breath.  She is obviously very anxious on exam.  She reports anxiety related to her symptoms and not having a diagnosis.  I question whether she may be having some rebound hypertension related to clonidine.  EKG here shows no evidence of acute ischemic or arrhythmic changes.  Doubt primary ACS but will send screening troponins.  Chest x-ray obtained.  Patient has previously recently had a PE study that was negative 1 week ago.  She was given a dose of Ativan for anxiety.  Patient signed out to oncoming provider.  Final Clinical Impression(s) / ED Diagnoses Final diagnoses:  Palpitations  Primary hypertension  Anxiety    Rx / DC Orders ED Discharge Orders     None        Chaniya Genter, Mayer Masker, MD 06/01/21 [phone]

## 2021-06-01 NOTE — Telephone Encounter (Signed)
**Note De-Identified Declynn Lopresti Obfuscation** Urgent Nebivolol PA started through covermymeds. Key: [id number]

## 2021-06-01 NOTE — Discharge Instructions (Signed)
Follow-up with your cardiologist and primary doctor as previously arranged.  Reviewed medications with them. Return for new concerns.  Your blood work today was reassuring.

## 2021-06-02 MED ORDER — METOPROLOL TARTRATE 50 MG PO TABS: 75.0000 mg | ORAL_TABLET | Freq: Two times a day (BID) | ORAL | 0 refills | Status: DC

## 2021-06-02 NOTE — Telephone Encounter (Signed)
**Note De-Identified Bridget Nichols Obfuscation** Per letter from [facility] the pt must first try and fail at least 2 of the preferred step therapy drugs which are: Atenolol, Bisoprolol, Metoprolol, Carvedilol, Betaxolol, or Acebutolol.  The pt is currently taking Metoprolol so she will need to try and fail one of the beta blockers listed above prior to them approving this Nebivolol PA.   Forwarding message to Dr Lynnette Caffey and his nurse for advisement to the pt.

## 2021-06-02 NOTE — Telephone Encounter (Signed)
Spoke with patient.  Made her aware of recommendations from [facility].  She will pick up and begin metoprolol tartrate 75 mg twice daily and monitor BPs. She will call in one week with update on her readings.    Pt aware to hold the 75 mg dose in AM of 11/17 when she is to take the 100 mg dose for her cardiac cta.

## 2021-06-02 NOTE — Telephone Encounter (Signed)
OK.   She is on one time metorpolol for her CTA scan.   Let's put her on metoprolol 75 bid and have her call with her BP in one week.  If it's high, then we can try carvedilol 75 bid.  If her BP is still high, then I want her to start bystolic and see pharmacists.  Thank you.

## 2021-06-08 ENCOUNTER — Telehealth (HOSPITAL_COMMUNITY): Payer: Self-pay | Admitting: Emergency Medicine

## 2021-06-08 ENCOUNTER — Other Ambulatory Visit: Payer: Self-pay | Admitting: Family Medicine

## 2021-06-08 DIAGNOSIS — R768 Other specified abnormal immunological findings in serum: Secondary | ICD-10-CM

## 2021-06-08 DIAGNOSIS — I10 Essential (primary) hypertension: Secondary | ICD-10-CM

## 2021-06-08 NOTE — Telephone Encounter (Signed)
Reaching out to patient to offer assistance regarding upcoming cardiac imaging study; pt verbalizes understanding of appt date/time, parking situation and where to check in, pre-test NPO status and medications ordered, and verified current allergies; name and call back number provided for further questions should they arise Rockwell Alexandria RN Navigator Cardiac Imaging [facility] [phone] office [phone] cell  Arrive 8:30a 100mg  metoprolol 2 hr prior to scan Patient scheduled for echo at [facility] at 7:00am Does not take HCTZ anymore Denies iv issues

## 2021-06-09 ENCOUNTER — Ambulatory Visit (HOSPITAL_BASED_OUTPATIENT_CLINIC_OR_DEPARTMENT_OTHER): Payer: 59

## 2021-06-09 ENCOUNTER — Other Ambulatory Visit: Payer: Self-pay

## 2021-06-09 ENCOUNTER — Other Ambulatory Visit: Payer: Self-pay | Admitting: Cardiology

## 2021-06-09 ENCOUNTER — Ambulatory Visit (HOSPITAL_COMMUNITY)
Admission: RE | Admit: 2021-06-09 | Discharge: 2021-06-09 | Disposition: A | Discharge status: Home / Self Care | Payer: 59 | Source: Ambulatory Visit | Attending: Cardiology | Admitting: Cardiology

## 2021-06-09 ENCOUNTER — Encounter (HOSPITAL_COMMUNITY): Payer: Self-pay

## 2021-06-09 ENCOUNTER — Ambulatory Visit (HOSPITAL_COMMUNITY)
Admission: RE | Admit: 2021-06-09 | Discharge: 2021-06-09 | Disposition: A | Discharge status: Home / Self Care | Payer: 59 | Source: Ambulatory Visit | Attending: Internal Medicine | Admitting: Internal Medicine

## 2021-06-09 DIAGNOSIS — I10 Essential (primary) hypertension: Secondary | ICD-10-CM | POA: Diagnosis not present

## 2021-06-09 DIAGNOSIS — R931 Abnormal findings on diagnostic imaging of heart and coronary circulation: Secondary | ICD-10-CM | POA: Diagnosis not present

## 2021-06-09 DIAGNOSIS — R079 Chest pain, unspecified: Secondary | ICD-10-CM | POA: Insufficient documentation

## 2021-06-09 DIAGNOSIS — R0789 Other chest pain: Secondary | ICD-10-CM

## 2021-06-09 DIAGNOSIS — I700 Atherosclerosis of aorta: Secondary | ICD-10-CM | POA: Insufficient documentation

## 2021-06-09 DIAGNOSIS — R072 Precordial pain: Secondary | ICD-10-CM

## 2021-06-09 LAB — ECHOCARDIOGRAM COMPLETE
Area-P 1/2: 3.77 cm2
S' Lateral: 2.2 cm

## 2021-06-09 MED ORDER — NITROGLYCERIN 0.4 MG SL SUBL
SUBLINGUAL_TABLET | SUBLINGUAL | Status: AC
  Administered 2021-06-09: 09:00:00 0.8 mg
  Filled 2021-06-09: qty 2

## 2021-06-09 MED ORDER — IOHEXOL 350 MG/ML SOLN
95.0000 mL | Freq: Once | INTRAVENOUS | Status: AC | PRN
  Administered 2021-06-09: 09:00:00 95 mL via INTRAVENOUS

## 2021-06-09 MED ORDER — NITROGLYCERIN 0.4 MG SL SUBL: 0.8000 mg | SUBLINGUAL_TABLET | Freq: Once | SUBLINGUAL | Status: DC

## 2021-06-14 ENCOUNTER — Telehealth: Payer: Self-pay | Admitting: Pharmacist

## 2021-06-14 ENCOUNTER — Other Ambulatory Visit: Payer: Self-pay

## 2021-06-14 ENCOUNTER — Ambulatory Visit (INDEPENDENT_AMBULATORY_CARE_PROVIDER_SITE_OTHER): Payer: 59 | Admitting: Pharmacist

## 2021-06-14 VITALS — BP 162/88 | HR 83

## 2021-06-14 DIAGNOSIS — I10 Essential (primary) hypertension: Secondary | ICD-10-CM

## 2021-06-14 LAB — BASIC METABOLIC PANEL
BUN/Creatinine Ratio: 14 (ref 12–28)
BUN: 9 mg/dL (ref 8–27)
CO2: 24 mmol/L (ref 20–29)
Calcium: 9.9 mg/dL (ref 8.7–10.3)
Chloride: 100 mmol/L (ref 96–106)
Creatinine, Ser: 0.66 mg/dL (ref 0.57–1.00)
Glucose: 99 mg/dL (ref 70–99)
Potassium: 4.1 mmol/L (ref 3.5–5.2)
Sodium: 139 mmol/L (ref 134–144)
eGFR: 98 mL/min/{1.73_m2} (ref >59)

## 2021-06-14 MED ORDER — HYDRALAZINE HCL 25 MG PO TABS: 37.5000 mg | ORAL_TABLET | Freq: Three times a day (TID) | ORAL | 3 refills | Status: DC

## 2021-06-14 MED ORDER — SPIRONOLACTONE 25 MG PO TABS: 12.5000 mg | ORAL_TABLET | Freq: Every day | ORAL | 3 refills | Status: AC

## 2021-06-14 NOTE — Patient Instructions (Signed)
Start taking hydralazine 37.5 (1.5 tablets) by mouth 3 times a day Continue lisinopril 40mg  and clonidine I will call you tomorrow with your lab results Please call me at [phone] with any questions

## 2021-06-14 NOTE — Telephone Encounter (Signed)
BMP stable. Will go ahead and start spironolactone 12.5mg  daily as discussed in office today. Repeat BMP 12/5. LVM for pt to call back.

## 2021-06-14 NOTE — Progress Notes (Signed)
Patient ID: Bridget Nichols                 DOB: August 25, 1955                      MRN: [id number]     HPI: Bridget Nichols is a 65 y.o. female referred by Dr. Lynnette Caffey to HTN clinic. PMH is significant for HTN, anxiety and aortic atherosclerosis seen on CT scan.  She was seen in the emergency department in early September with a blood pressure of over . She was seen by her primary care provider and her lisinopril was stopped because it was not helping.  She had previously been on amlodipine 10 mg and hydrochlorothiazide.  These were stopped when clonidine was started.   At last visit on 11/7 with Dr. Lynnette Caffey, lisinopril 40mg  daily was resumed and metoprolol 75mg  BID was started. She was seen on 11/9 in ED for palpitations. Symptoms were thought to mainly be related to anxiety and possibly rebound HTN from clonidine. Renal ultrasound normal- no evidence of renal artery stenosis. She was started on atorvastatin for atherosclerosis seen on CT.   Patient presents today to clinic. She admits that she has not started her atorvastatin. Would like to see endocrinology first. She is taking clonidine 4-5 times per day. It is the only thing that keeps her BP down per patient. She is taking her hydralazine in 4-5hr intervals, 4 times a day. Is not on the clonidine patch, it did not work. She stopped taking her metoprolol as it caused her nausea, dizziness and a body temperature of 94.5. She does admit she struggles with anxiety. She took antidepressants in the past but does not like the way they make her feel. She takes 0.25mg  of clonazepam a couple times a week when her heart starts racing.   She complains of blurry vision, headaches, pressure on both sides of neck, lump in throat, hair loss, bright flashes around periphery of eye- has been eye dr, fatigue, loss of appetite and always feels cold. Took her last clonidine dose around 8AM. Blood pressure starts around 147-150's systolic in the AM and increases  throughout the day. She checks BP 4 times a day, 3 times each check and takes the average.   She lives alone, which scares her. She plans to move to [place] to live with her sister, but not until she gets all her medicals things under control.   Current HTN meds: lisinopril 40mg  daily, hydralazine 20mg  four times a day,  clonidine 0.2mg  4-5 times a day as needed. Previously tried:  BP goal: <130//80  Family History: No family history on file.  Social History: former smoker, alcohol 1-2 beers couple times a week, no illicit drugs  Diet: organic vegetarian, no processed food  Exercise: walks, stopped doing intensive cardio during her work-up  Home BP readings: 150/80-210/97  Wt Readings from Last 3 Encounters:  06/01/21 129 lb (58.5 kg)  05/30/21 131 lb (59.4 kg)  03/31/21 130 lb (59 kg)   BP Readings from Last 3 Encounters:  06/09/21 136/64  06/01/21 115/68  05/30/21 (!) 174/90   Pulse Readings from Last 3 Encounters:  06/09/21 61  06/01/21 (!) 55  05/30/21 74    Renal function: Estimated Creatinine Clearance: 61.3 mL/min (by C-G formula based on SCr of 0.61 mg/dL).  Past Medical History:  Diagnosis Date   Anxiety    High blood pressure    Hypertension  Current Outpatient Medications on File Prior to Visit  Medication Sig Dispense Refill   aspirin EC 81 MG tablet Take 1 tablet (81 mg total) by mouth daily. Swallow whole. 90 tablet 3   atorvastatin (LIPITOR) 40 MG tablet Take 1 tablet (40 mg total) by mouth daily. 90 tablet 3   cloNIDine (CATAPRES - DOSED IN MG/24 HR) 0.2 mg/24hr patch Place 0.2 mg onto the skin once a week. (Patient not taking: Reported on 05/30/2021)     cloNIDine (CATAPRES) 0.1 MG tablet Take 2 tablets (0.2 mg total) by mouth 2 (two) times daily as needed. 20 tablet 0   Collagen-Vitamin C-Biotin (COLLAGEN 1500/C PO) Take by mouth.     hydrALAZINE (APRESOLINE) 10 MG tablet Take 20 mg by mouth 4 (four) times daily.     hydrochlorothiazide  (HYDRODIURIL) 25 MG tablet Take 25 mg by mouth daily. (Patient not taking: Reported on 05/30/2021)     Levomefolate Glucosamine (METHYLFOLATE) 400 MCG CAPS Take by mouth.     lisinopril (ZESTRIL) 40 MG tablet Take 1 tablet (40 mg total) by mouth daily. 90 tablet 3   LORazepam (ATIVAN) 0.5 MG tablet Take 0.5 mg by mouth every 8 (eight) hours.     magnesium oxide (MAG-OX) 400 MG tablet Take 400 mg by mouth daily. (Patient not taking: Reported on 05/30/2021)     metoprolol tartrate (LOPRESSOR) 100 MG tablet Take 1 tablet (100 mg total) by mouth once for 1 dose. Take two hours prior to CT scan 1 tablet 0   metoprolol tartrate (LOPRESSOR) 50 MG tablet Take 1.5 tablets (75 mg total) by mouth 2 (two) times daily. 90 tablet 0   Omega-3 1000 MG CAPS Take by mouth.     Taurine 500 MG CAPS Take by mouth.     No current facility-administered medications on file prior to visit.    Allergies  Allergen Reactions   Tetracycline Hives    There were no vitals taken for this visit.   Assessment/Plan:  1. Hypertension - Blood pressure is above goal of <130/80. Patient is understandably concerned and frustrated that no cause of her extremely elevated blood pressure has been found. We did discuss that her anxiety is most likely a large contributor. We talked about the importance of working with a phycologist to get this under better control. Most of her symptoms listed in the S/O are more than likely from her clonidine. She states that she is tired of feeling poorly. We did discuss this and the goal of getting her eventually off of this medication. She understands that this may take time and may require some trial and error. Will go ahead and increase her hydralazine to 37.5mg  THREE times a day. There is no need to take this medication 4 times a day, as it will not be evenly spread and it puts a lot of burden on the patient. Will check BMP today since resuming lisinopril. If BMP is stable, will plan to start  spironolactone 12.5mg  daily. F/u with labs on 12/5 and telephone call 12/6 due to no OV being available. Will adjust meds at that point based off of labs and BP. F/u in office 12/15.   Thank you  Olene Floss, Pharm.D, BCPS, CPP [facility]  [address] [place], [place] [address]  Phone: [phone]; Fax: [phone]

## 2021-06-15 NOTE — Telephone Encounter (Signed)
I spoke with patient. Advised medication change below. She states she is nervous about traveling with new medications and wishes to wait until Tuesday when she returns to start. Advised she should still increase her hydralazine to 37.5mg  three times a day. Start spironolactone on Tuesday.

## 2021-06-24 ENCOUNTER — Telehealth: Payer: Self-pay | Admitting: Internal Medicine

## 2021-06-24 ENCOUNTER — Ambulatory Visit
Admission: RE | Admit: 2021-06-24 | Discharge: 2021-06-24 | Disposition: A | Discharge status: Home / Self Care | Payer: 59 | Source: Ambulatory Visit | Attending: Family Medicine | Admitting: Family Medicine

## 2021-06-24 DIAGNOSIS — I10 Essential (primary) hypertension: Secondary | ICD-10-CM

## 2021-06-24 DIAGNOSIS — R768 Other specified abnormal immunological findings in serum: Secondary | ICD-10-CM

## 2021-06-24 NOTE — Telephone Encounter (Signed)
Patient called stating she didn't start her spironolactone until 11/29 does she still need labs on 12/5. I called pt and advised that we can move her labs to the day I see her in the office 12/15. She asked about cutting down on clonidine. She is down to 3 tablet a day. I advised for her to stay at 3 tablets a day for at least a week. If she feels she can go to 2 tablets a day she may cut back to 2 tablets a day after that. Will follow up in office 12/15.

## 2021-06-24 NOTE — Telephone Encounter (Signed)
Patient wanted to speak to the Pharmacist before her upcoming appointment

## 2021-06-26 ENCOUNTER — Other Ambulatory Visit: Payer: Self-pay | Admitting: Internal Medicine

## 2021-06-27 ENCOUNTER — Other Ambulatory Visit: Payer: 59

## 2021-06-27 ENCOUNTER — Ambulatory Visit: Payer: 59 | Admitting: Cardiovascular Disease

## 2021-07-07 ENCOUNTER — Other Ambulatory Visit: Payer: 59

## 2021-07-07 ENCOUNTER — Other Ambulatory Visit: Payer: Self-pay

## 2021-07-07 ENCOUNTER — Ambulatory Visit (INDEPENDENT_AMBULATORY_CARE_PROVIDER_SITE_OTHER): Payer: 59 | Admitting: Pharmacist

## 2021-07-07 VITALS — BP 146/70 | HR 70

## 2021-07-07 DIAGNOSIS — I10 Essential (primary) hypertension: Secondary | ICD-10-CM

## 2021-07-07 LAB — BASIC METABOLIC PANEL
BUN/Creatinine Ratio: 16 (ref 12–28)
BUN: 10 mg/dL (ref 8–27)
CO2: 22 mmol/L (ref 20–29)
Calcium: 9.9 mg/dL (ref 8.7–10.3)
Chloride: 95 mmol/L — ABNORMAL LOW (ref 96–106)
Creatinine, Ser: 0.64 mg/dL (ref 0.57–1.00)
Glucose: 90 mg/dL (ref 70–99)
Potassium: 4.5 mmol/L (ref 3.5–5.2)
Sodium: 133 mmol/L — ABNORMAL LOW (ref 134–144)
eGFR: 99 mL/min/{1.73_m2} (ref >59)

## 2021-07-07 LAB — URIC ACID: Uric Acid: 4.9 mg/dL (ref 3.0–7.2)

## 2021-07-07 NOTE — Progress Notes (Signed)
Patient ID: Bridget Nichols                 DOB: [birth year]                      MRN: [id number]     HPI: Bridget Nichols is a 65 y.o. female referred by Dr. Lynnette Caffey to HTN clinic. PMH is significant for HTN, anxiety and aortic atherosclerosis seen on CT scan.  She was seen in the emergency department in early September with a blood pressure of over . She was seen by her primary care provider and her lisinopril was stopped because it was not helping.  She had previously been on amlodipine 10 mg and hydrochlorothiazide.  These were stopped when clonidine was started.   At last visit on 11/7 with Dr. Lynnette Caffey, lisinopril 40mg  daily was resumed and metoprolol 75mg  BID was started. She was seen on 11/9 in ED for palpitations. Symptoms were thought to mainly be related to anxiety and possibly rebound HTN from clonidine. Renal ultrasound normal- no evidence of renal artery stenosis. She was started on atorvastatin for atherosclerosis seen on CT.   Patient was seen in the HTN clinic last on 11/22. At this visit hydralazine was changed from 20mg  4 times a day to 37.5mg  three times a day. She started on spironolactone 12.5mg  daily on 11/29 and she decreased her clonidine use to 3 times a day. She was encouraged to see a phycologist as her BP issues appear to be stemming from anxiety.   Patient presents today to clinic for follow up. She reports that her blood pressure is the same although the reading I got in clinic today (146/70) was much better than 162/88 last visits. She denies dizziness, lightheadedness (other than feeling a little spacey last night). Headaches and blurred vision cleared about 1 month ago. No SOB, swelling. She has 3 referrals into phycologists. Should hear back soon from them about setting up an appointment. She checks her BP in the AM sometimes. Does not check in regards to her medications. She also checks her BP around 3PM. It is not always super high, but usually it is. She does have  fears of living alone and the night time is worse.  Is taking her lorazepam. She plans to move to [place] 12/22 to live with her sister. Upset and wants to know why her BP is so high. Insurance wont pay for CT of her head but she might pay cash because she needs to make sure there is no tumors or bleeding.  Current HTN meds: lisinopril 40mg  daily, hydralazine 37.5mg  three times a day (7-8AM, 5PM, 10PM) spironolactone 12.5mg  daily, clonidine 0.2mg  3 times a day as needed (10AM, 2-4PM, 10PM) Previously tried: metoprolol (nausea, dizziness, hypothermia per pt) BP goal: <130//80  Family History: No family history on file.  Social History: former smoker, alcohol 1-2 beers couple times a week, no illicit drugs  Diet: organic vegetarian, no processed food  Exercise: walks, stopped doing intensive cardio during her work-up  Home BP readings: starts in the high 140's low 150's/high 70's to low 80's , late afternoon 190/90 or higher  Wt Readings from Last 3 Encounters:  06/01/21 129 lb (58.5 kg)  05/30/21 131 lb (59.4 kg)  03/31/21 130 lb (59 kg)   BP Readings from Last 3 Encounters:  06/14/21 (!) 162/88  06/09/21 136/64  06/01/21 115/68   Pulse Readings from Last 3 Encounters:  06/14/21 83  06/09/21 61  06/01/21 (!) 55    Renal function: CrCl cannot be calculated (Patient's most recent lab result is older than the maximum 21 days allowed.).  Past Medical History:  Diagnosis Date   Anxiety    High blood pressure    Hypertension     Current Outpatient Medications on File Prior to Visit  Medication Sig Dispense Refill   aspirin EC 81 MG tablet Take 1 tablet (81 mg total) by mouth daily. Swallow whole. 90 tablet 3   atorvastatin (LIPITOR) 40 MG tablet Take 1 tablet (40 mg total) by mouth daily. (Patient not taking: Reported on 06/14/2021) 90 tablet 3   clonazePAM (KLONOPIN) 0.5 MG tablet Take 0.25 mg by mouth 2 (two) times daily as needed for anxiety.     cloNIDine (CATAPRES) 0.1 MG  tablet Take 2 tablets (0.2 mg total) by mouth 2 (two) times daily as needed. (Patient taking differently: Take 0.2 mg by mouth 2 (two) times daily as needed. 4-5 times a day as need per pt) 20 tablet 0   Collagen-Vitamin C-Biotin (COLLAGEN 1500/C PO) Take by mouth.     hydrALAZINE (APRESOLINE) 25 MG tablet Take 1.5 tablets (37.5 mg total) by mouth 3 (three) times daily. 405 tablet 3   Levomefolate Glucosamine (METHYLFOLATE) 400 MCG CAPS Take by mouth.     lisinopril (ZESTRIL) 40 MG tablet Take 1 tablet (40 mg total) by mouth daily. 90 tablet 3   LORazepam (ATIVAN) 0.5 MG tablet Take 0.5 mg by mouth every 8 (eight) hours. (Patient not taking: Reported on 06/14/2021)     magnesium oxide (MAG-OX) 400 MG tablet Take 400 mg by mouth daily. (Patient not taking: Reported on 05/30/2021)     metoprolol tartrate (LOPRESSOR) 100 MG tablet Take 1 tablet (100 mg total) by mouth once for 1 dose. Take two hours prior to CT scan 1 tablet 0   Omega-3 1000 MG CAPS Take by mouth.     spironolactone (ALDACTONE) 25 MG tablet Take 0.5 tablets (12.5 mg total) by mouth daily. 45 tablet 3   Taurine 500 MG CAPS Take by mouth.     No current facility-administered medications on file prior to visit.    Allergies  Allergen Reactions   Tetracycline Hives    There were no vitals taken for this visit.   Assessment/Plan:  1. Hypertension - Blood pressure is above goal of <130/80. Patient is understandably concerned and frustrated that no cause of her extremely elevated blood pressure has been found. She knows that her anxiety is most likely a large contributor, although not sure which came first. We did discuss that there is some evidence that elevated uric acid could be a contributor. More research is still being conducted. Patient agreeable to checking uric acid level. Will increase hydralazine to 50mg  in the afternoon and remain at 37.5mg  in the AM and PM. BMP drawn today. If stable, will increase spironolactone to 25mg   daily. I will call patient tomorrow to review labs and confirm and changes. Continue lisinopril 40mg  daily and clonidine 0.2mg  three times a day. She has been checking her BP with her right arm. I have asked her to use her left and to bring her cuff to next visit. Follow up in 3 weeks due to provider schedule. Ultimate goal would be to get her off of clonidine.  Thank you  , Pharm.D, BCPS, CPP [facility]  [address] [place], [place] [address]  Phone: [phone]; Fax: [phone]

## 2021-07-07 NOTE — Patient Instructions (Signed)
Start checking your blood pressure using your left arm  Start taking hydralazine 37.5mg  in the AM, 50mg  in there afternoon and 37.5mg  in the evening. I will call you tomorrow with your lab results and if everything looks good, we will increase spironolactone to 25mg  daily.  Call me at [phone] with any questions

## 2021-07-07 NOTE — Addendum Note (Signed)
Addended by: Malena Peer D on: 07/07/2021 11:01 AM   Modules accepted: Orders

## 2021-07-08 ENCOUNTER — Telehealth: Payer: Self-pay | Admitting: Pharmacist

## 2021-07-08 MED ORDER — AMLODIPINE BESYLATE 5 MG PO TABS: 5.0000 mg | ORAL_TABLET | Freq: Every day | ORAL | 3 refills | Status: AC

## 2021-07-08 NOTE — Telephone Encounter (Addendum)
Sodium just slightly low. Will not increase spironolactone. Continue spironolactone 12.5mg  daily, lisinopril 40mg  daily, clonidine 0.2mg  three times a day. Add amlodipine 5mg  daily. Called pt and made her aware. She reports acid reflux in the PM. Advised to try tums. Uric acid was normal <6

## 2021-07-29 ENCOUNTER — Ambulatory Visit: Payer: 59

## 2021-08-27 NOTE — Progress Notes (Deleted)
Cardiology Office Note:    Date:  08/27/2021   ID:  Bridget Nichols, DOB 04/24/1956, MRN [id number]  PCP:  Eunice Blase, MD   [facility] Prov[facility] Cardiologist:  Lenna Sciara, MD Referring MD: Eunice Blase, MD   Chief Complaint/Reason for Referral: Follow-up  ASSESSMENT:    Hypertension, unspecified type  Other chest pain  Atherosclerosis of aorta [facility])    PLAN:    In order of problems listed above:  1.  Hypertension consider increasing amlodipine.  2.  Consider CTA   3.  Follow-up lipid panel          {Are you ordering a CV Procedure (e.g. stress test, cath, DCCV, TEE, etc)?   Press F2        :[id number]   Dispo:  No follow-ups on file.     Medication Adjustments/Labs and Tests Ordered: Current medicines are reviewed at length with the patient today.  Concerns regarding medicines are outlined above.   Tests Ordered: No orders of the defined types were placed in this encounter.   Medication Changes: No orders of the defined types were placed in this encounter.   History of Present Illness:    FOCUSED CARDIOVASCULAR PROBLEM LIST:   1.  Hypertension; renal ultrasound November 2022 negative for renal artery stenosis 2.  Aortic atherosclerosis seen on PE CT scan   The patient is a 66 y.o. female with the indicated medical history here for follow-up.  When I saw her last I referred her for coronary CTA, echocardiogram, she was started on atorvastatin 40 mg, Bystolic 2.5 mg, lisinopril 40 mg and aspirin 81 mg.  She was seen by pharmacy and her hydralazine in the afternoon was increased to 50 mg and it remained at 37.5 mg in the morning.  Amlodipine 5 mg was added.  It was believed that there was quite a significant anxiety component associated with her hypertension.        Previous Medical History: Past Medical History:  Diagnosis Date   Anxiety    High blood pressure    Hypertension      Current Medications: No outpatient medications have  been marked as taking for the 08/29/21 encounter (Appointment) with Early Osmond, MD.     Allergies:    Tetracycline   Social History:   Social History   Tobacco Use   Smoking status: Former    Types: Cigarettes    Passive exposure: Current   Smokeless tobacco: Never  Vaping Use   Vaping Use: Never used  Substance Use Topics   Alcohol use: Yes    Comment: occ   Drug use: Never     Family Hx: No family history on file.   Review of Systems:   Please see the history of present illness.    All other systems reviewed and are negative.     EKGs/Labs/Other Test Reviewed:    EKG:  SR with anterior infarct pattern  Prior CV studies: 11/22 TTE 1. Left ventricular ejection fraction, by estimation, is 60 to 65%. The  left ventricle has normal function. The left ventricle has no regional  wall motion abnormalities. There is mild left ventricular hypertrophy.  Left ventricular diastolic parameters  are indeterminate.   2. Right ventricular systolic function is normal. The right ventricular  size is normal. There is normal pulmonary artery systolic pressure. The  estimated right ventricular systolic pressure is [id number] mmHg.   3. The mitral valve is normal in structure. Trivial mitral valve  regurgitation. No  evidence of mitral stenosis.   4. The aortic valve is tricuspid. Aortic valve regurgitation is not  visualized. Aortic valve sclerosis/calcification is present, without any  evidence of aortic stenosis.   5. The inferior vena cava is normal in size with greater than 50%  respiratory variability, suggesting right atrial pressure of 3 mmHg.   Renal U/S 11/22 No Doppler evidence of significant renal artery stenosis.  Imaging studies that I have independently reviewed today: TTE and renal u/s  Recent Labs: 03/31/2021: ALT 11; B Natriuretic Peptide 50.4 06/01/2021: Hemoglobin 13.8; Platelets 337; TSH 4.507 07/07/2021: BUN 10; Creatinine, Ser 0.64; Potassium 4.5; Sodium 133    Recent Lipid Panel No results found for: CHOL, TRIG, HDL, LDLCALC, LDLDIRECT  Risk Assessment/Calculations:    {Does this patient have ATRIAL FIBRILLATION?:[id number]}      Physical Exam:    VS:  There were no vitals taken for this visit.   Wt Readings from Last 3 Encounters:  06/01/21 129 lb (58.5 kg)  05/30/21 131 lb (59.4 kg)  03/31/21 130 lb (59 kg)    GENERAL:  No apparent distress, AOx3 HEENT:  No carotid bruits, +2 carotid impulses, no scleral icterus CAR: RRR Irregular RR*** no murmurs***, gallops, rubs, or thrills RES:  Clear to auscultation bilaterally ABD:  Soft, nontender, nondistended, positive bowel sounds x 4 VASC:  +2 radial pulses, +2 carotid pulses, palpable pedal pulses NEURO:  CN 2-12 grossly intact; motor and sensory grossly intact PSYCH:  No active depression or anxiety EXT:  No edema, ecchymosis, or cyanosis  Signed, Early Osmond, MD  08/27/2021 5:59 AM    [address] [place], [place],   [address] Phone: [phone]; Fax: [phone]   Note:  This document was prepared using Dragon voice recognition software and may include unintentional dictation errors.

## 2021-08-29 ENCOUNTER — Other Ambulatory Visit: Payer: 59

## 2021-08-29 ENCOUNTER — Ambulatory Visit: Payer: 59 | Admitting: Internal Medicine

## 2021-08-29 DIAGNOSIS — I700 Atherosclerosis of aorta: Secondary | ICD-10-CM

## 2021-08-29 DIAGNOSIS — R0789 Other chest pain: Secondary | ICD-10-CM

## 2021-08-29 DIAGNOSIS — I10 Essential (primary) hypertension: Secondary | ICD-10-CM

## 2022-03-28 ENCOUNTER — Other Ambulatory Visit: Payer: Self-pay | Admitting: Family Medicine

## 2022-03-29 ENCOUNTER — Other Ambulatory Visit: Payer: Self-pay | Admitting: Family Medicine

## 2022-03-29 DIAGNOSIS — E063 Autoimmune thyroiditis: Secondary | ICD-10-CM

## 2022-03-30 ENCOUNTER — Ambulatory Visit
Admission: RE | Admit: 2022-03-30 | Discharge: 2022-03-30 | Disposition: A | Discharge status: Home / Self Care | Payer: Medicare HMO | Source: Ambulatory Visit | Attending: Family Medicine | Admitting: Family Medicine

## 2022-03-30 DIAGNOSIS — E063 Autoimmune thyroiditis: Secondary | ICD-10-CM

## 2022-05-28 ENCOUNTER — Other Ambulatory Visit: Payer: Self-pay | Admitting: Internal Medicine

## 2022-06-11 ENCOUNTER — Other Ambulatory Visit: Payer: Self-pay | Admitting: Internal Medicine

## 2023-11-02 IMAGING — US US THYROID
1 series · 14 of 25 positions shown · non-contrast
Comparison: None.

CLINICAL DATA: Other.  Positive thyroglobulin

EXAM:
THYROID ULTRASOUND
TECHNIQUE: Ultrasound examination of the thyroid gland and adjacent soft
tissues was performed.

[Series 1: us thyroid · 0.04mm/px · 14 of 34 slices shown]
[im 1/34]
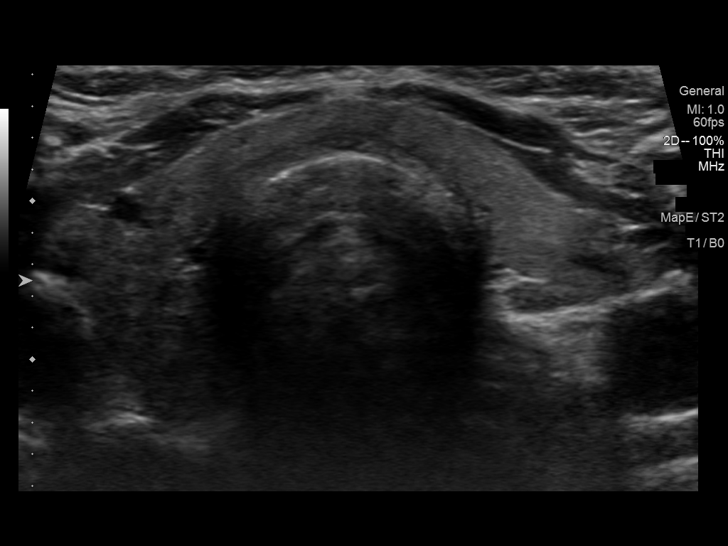
[im 3/34]
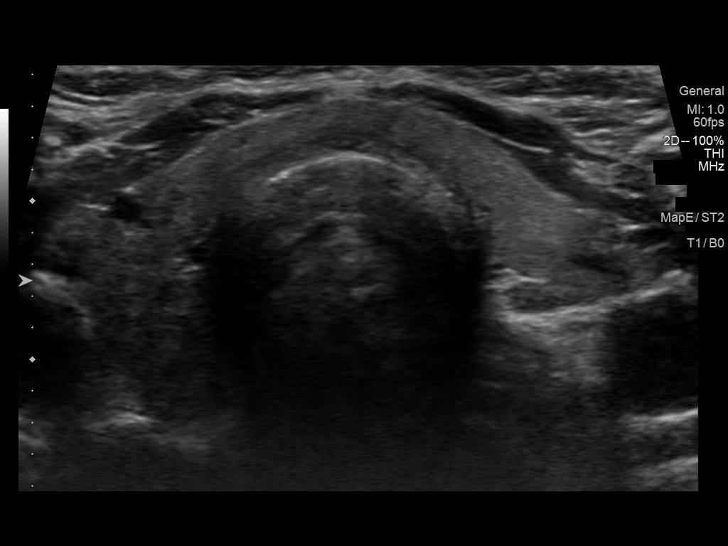
[im 6/34]
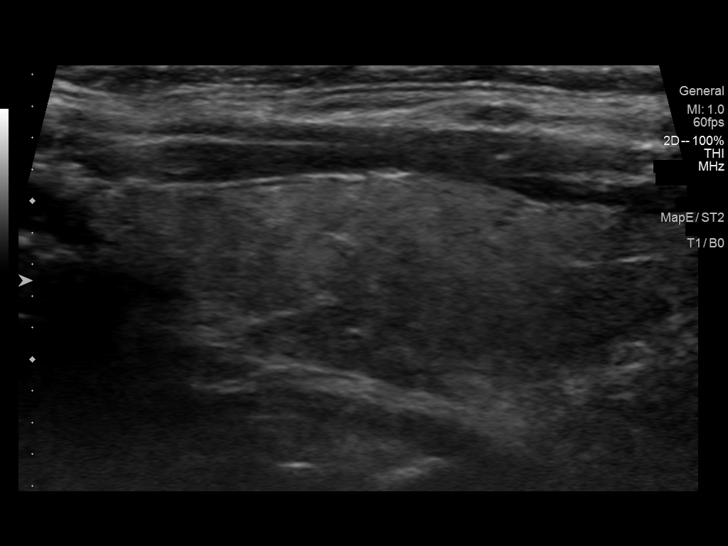
[im 9/34]
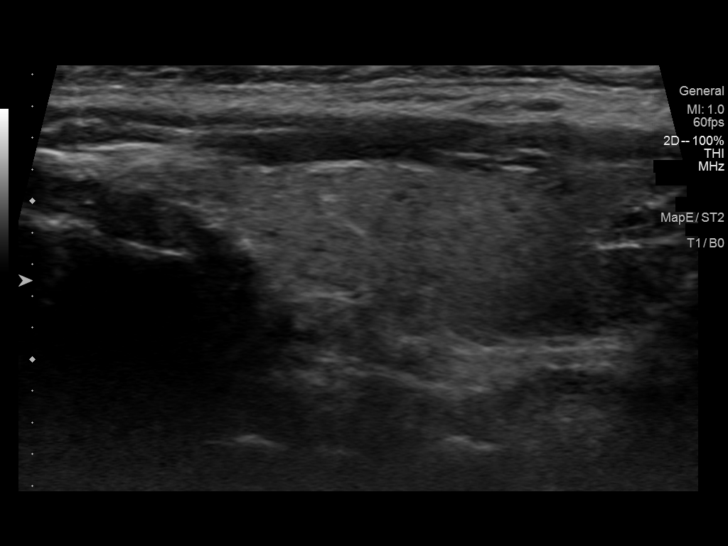
[im 12/34]
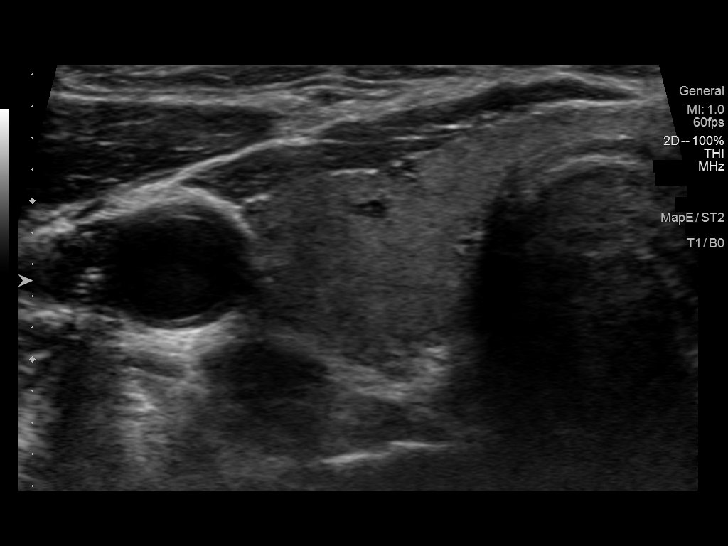
[im 13/34]
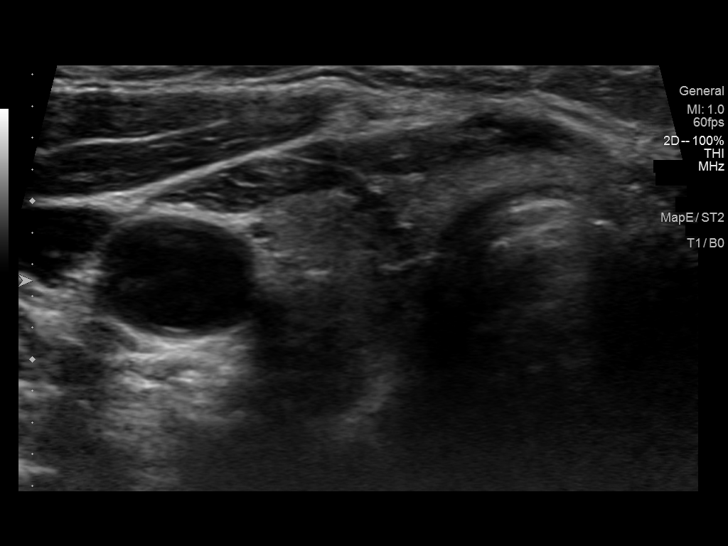
[im 16/34]
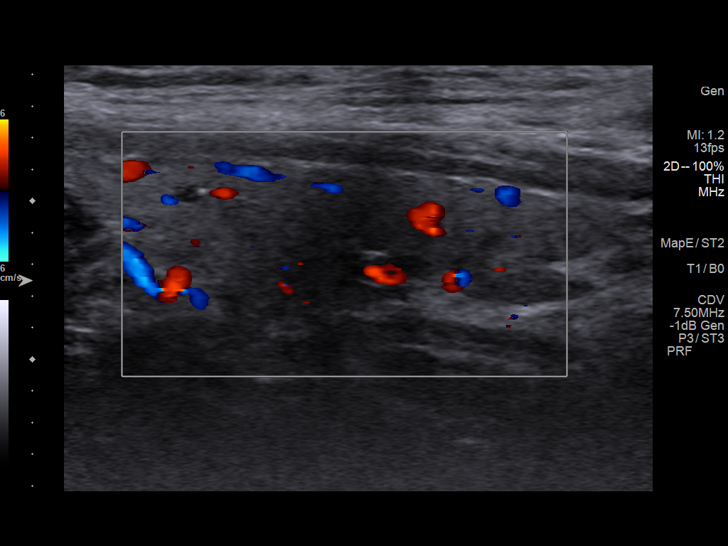
[im 18/34]
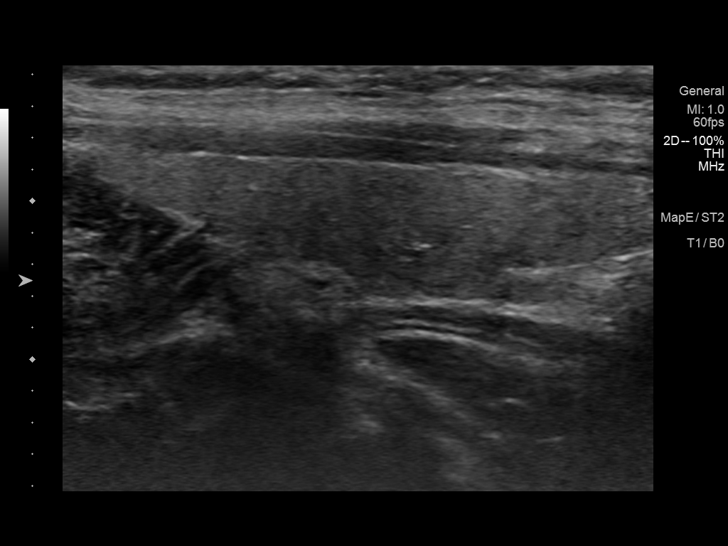
[im 21/34]
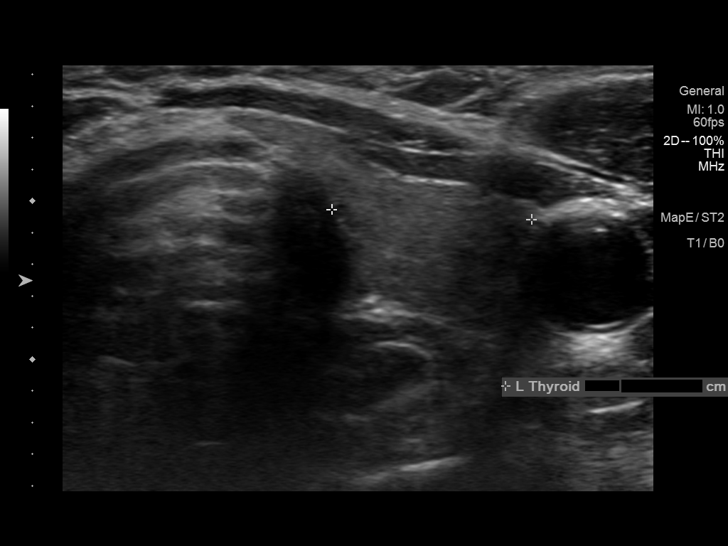
[im 23/34]
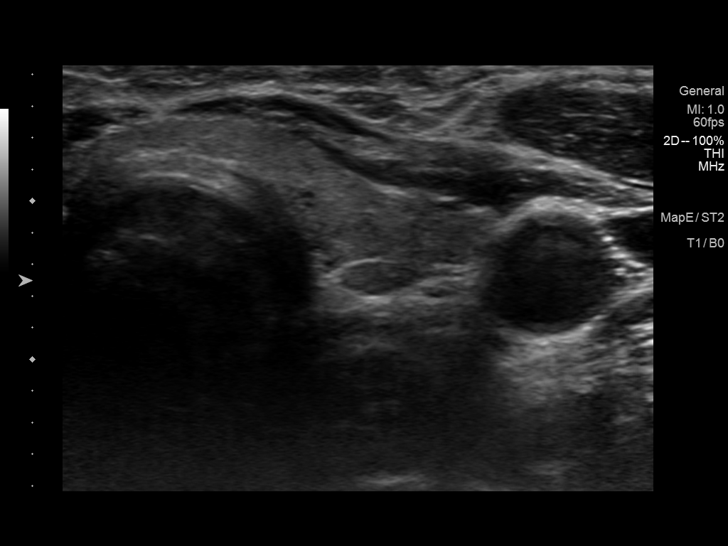
[im 25/34]
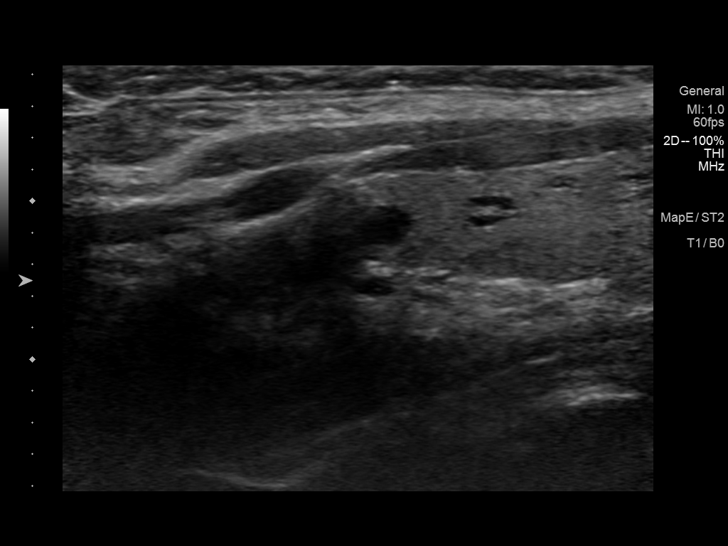
[im 28/34]
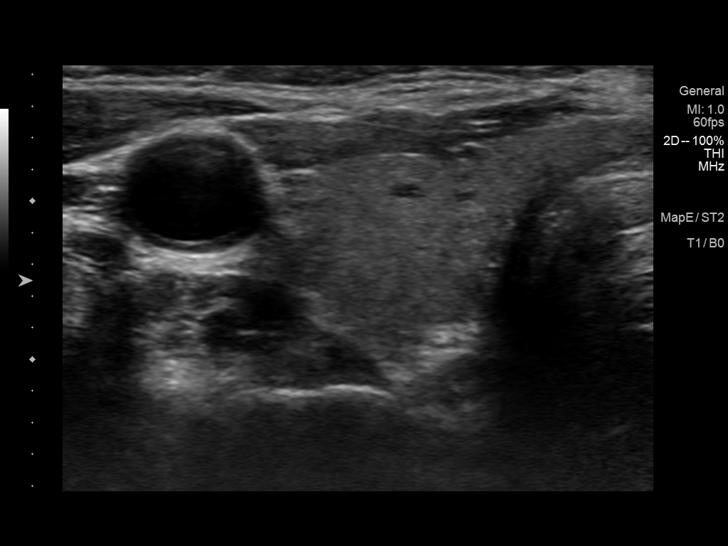
[im 31/34]
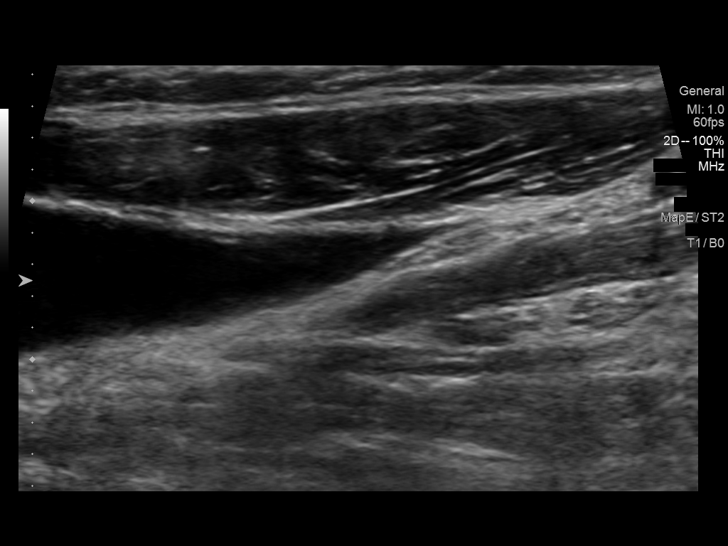
[im 34/34]
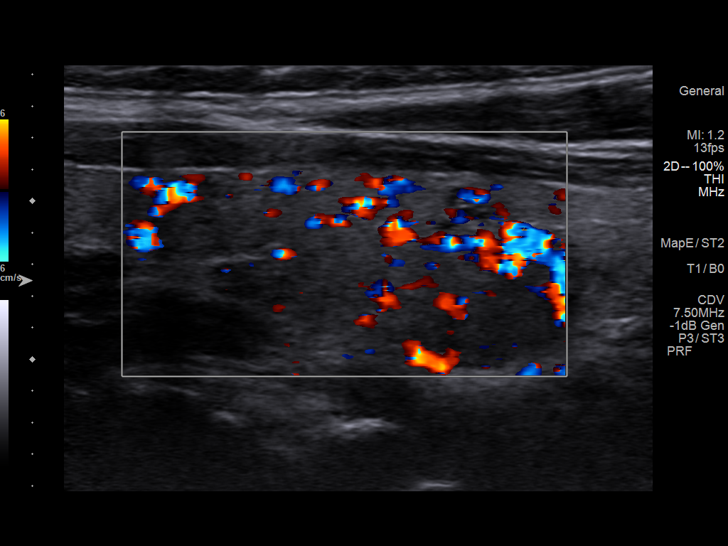

[14 of 25 positions shown; findings below may reference images not displayed]

FINDINGS: Parenchymal Echotexture: Mildly heterogenous

Isthmus: 0.3 cm

Right lobe: 3.8 x 1.1 x 1.5 cm

Left lobe: 3.5 x 1.0 x 1.3 cm

_________________________________________________________

Estimated total number of nodules >/= 1 cm: 0

Number of spongiform nodules >/=  2 cm not described below (TR1): 0

Number of mixed cystic and solid nodules >/= 1.5 cm not described
below (TR2): 0

_________________________________________________________

No discrete nodules are seen within the thyroid gland. Normal
vascularity. No significant hypervascularity.
IMPRESSION: Mildly heterogeneous but otherwise unremarkable thyroid gland.

No thyroid nodules.

The above is in keeping with the ACR TI-RADS recommendations - [HOSPITAL] 3075;[DATE].
